# Patient Record
Sex: Male | Born: 2000 | Race: White | Hispanic: No | Marital: Single | Smoking: Never smoker
Health system: Southern US, Community
[De-identification: ages and names within clinical notes are randomized; demographics above are authoritative.]

## PROBLEM LIST (undated history)

## (undated) DIAGNOSIS — J320 Chronic maxillary sinusitis: Secondary | ICD-10-CM

## (undated) DIAGNOSIS — J322 Chronic ethmoidal sinusitis: Secondary | ICD-10-CM

## (undated) DIAGNOSIS — J343 Hypertrophy of nasal turbinates: Secondary | ICD-10-CM

---
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
## 2018-06-02 IMAGING — CT CT MAXILLOFACIAL W/O CM
3 series · 13 of 47 positions shown, 15 images · non-contrast
Comparison: None.

CLINICAL DATA: LEFT-sided congestion.

EXAM:
CT MAXILLOFACIAL WITHOUT CONTRAST
TECHNIQUE: Multidetector CT imaging of the maxillofacial structures was
performed. Multiplanar CT image reconstructions were also generated.
A small metallic BB was placed on the right temple in order to
reliably differentiate right from left.

[Series 2: standard · axial · 0.34mm/px · z∈[+1338,+1434]mm · 7 of 117 slices shown, 9 images]
[im 13/117  brain]
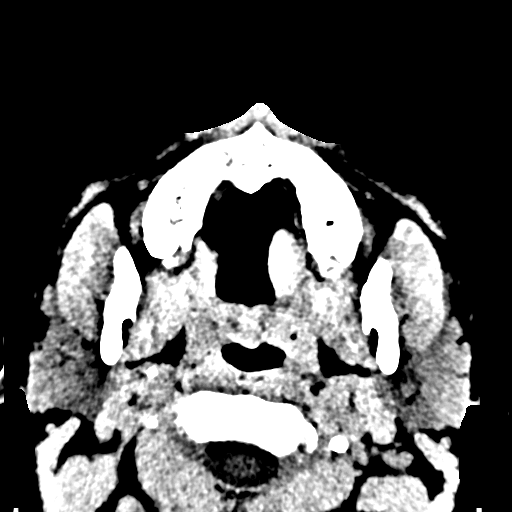
[im 13/117  bone]
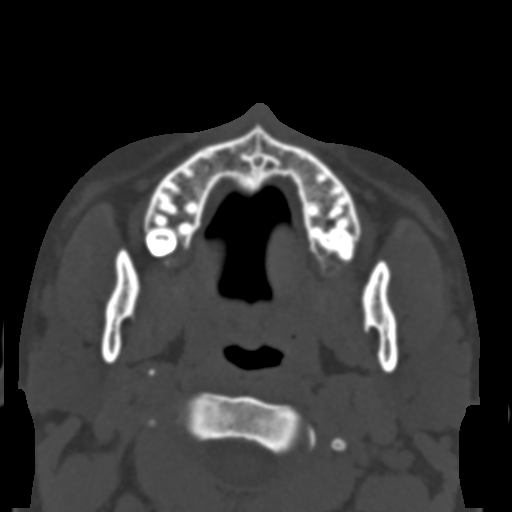
[im 29/117  bone]
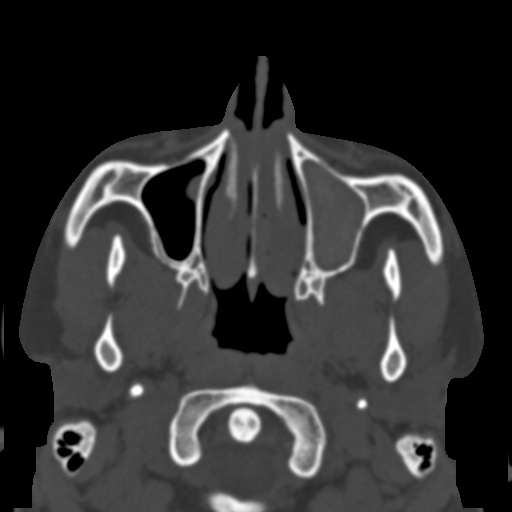
[im 45/117  bone]
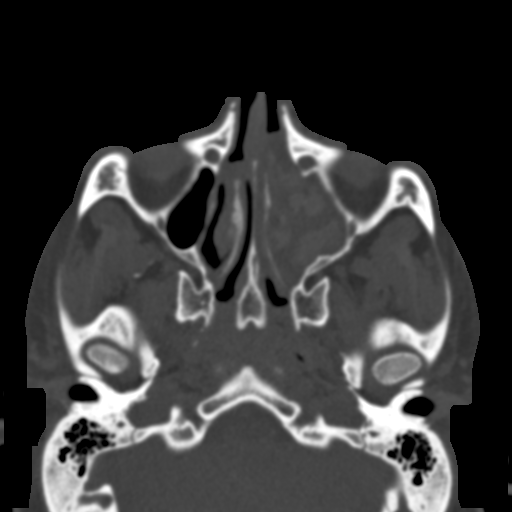
[im 61/117  bone]
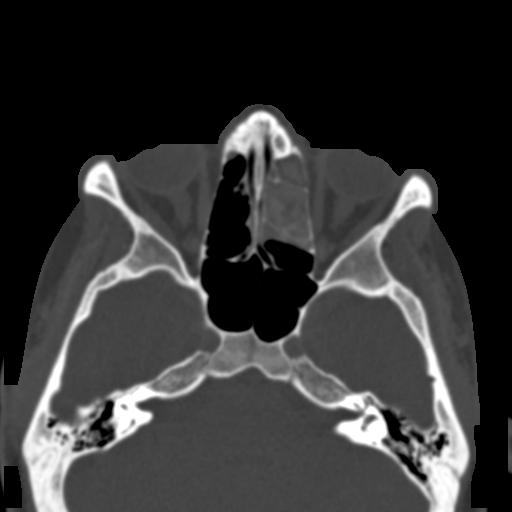
[im 77/117  brain]
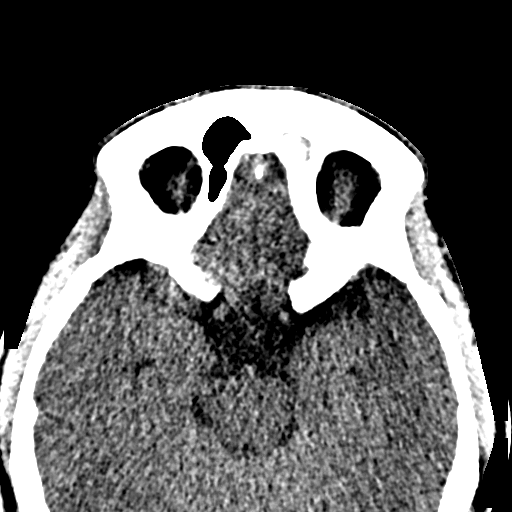
[im 77/117  bone]
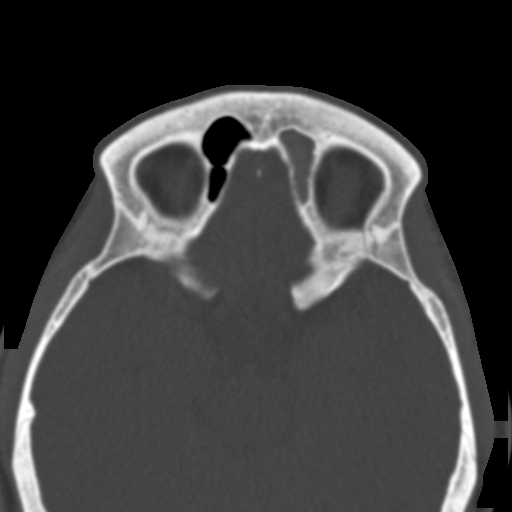
[im 93/117  bone]
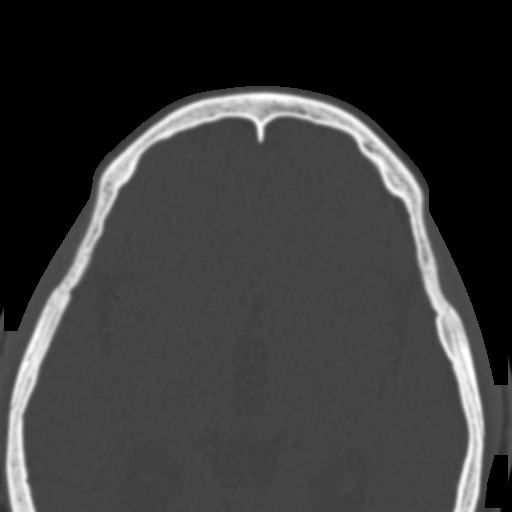
[im 109/117  bone]
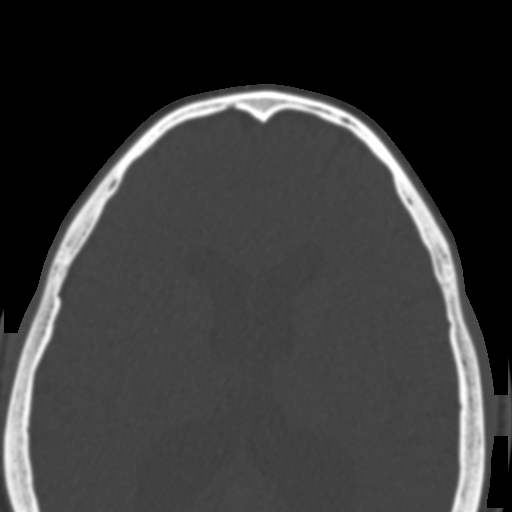

[Series 4: coronal · coronal · 0.25mm/px · 3 of 106 slices shown]
[im 36/106  bone]
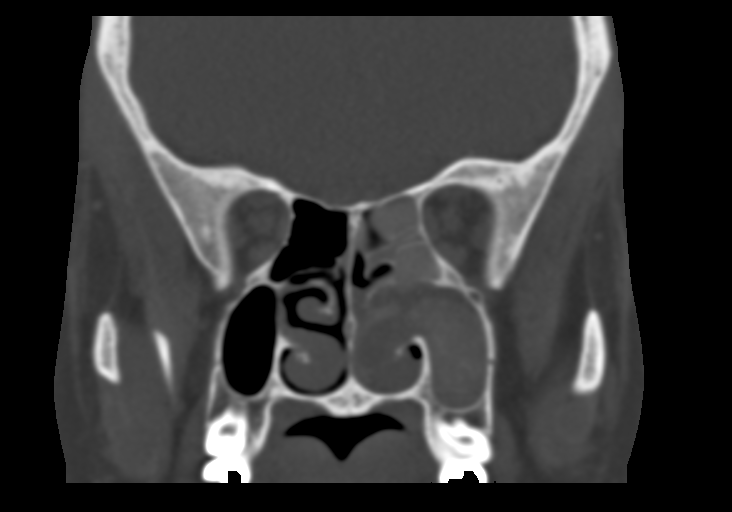
[im 47/106  bone]
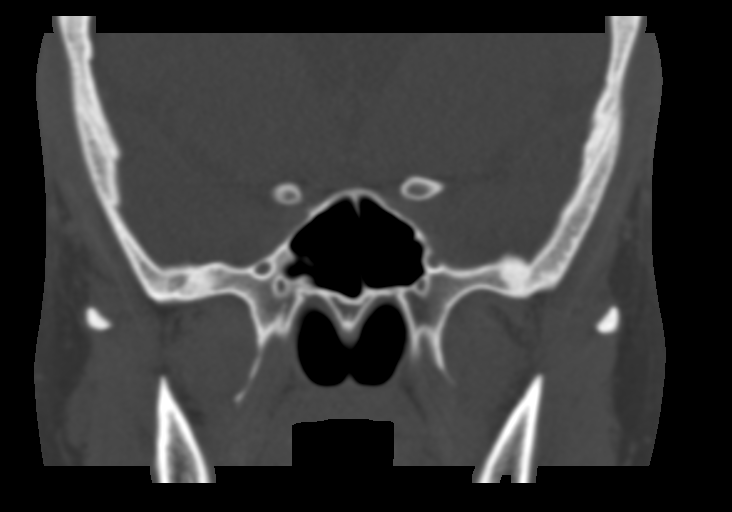
[im 59/106  bone]
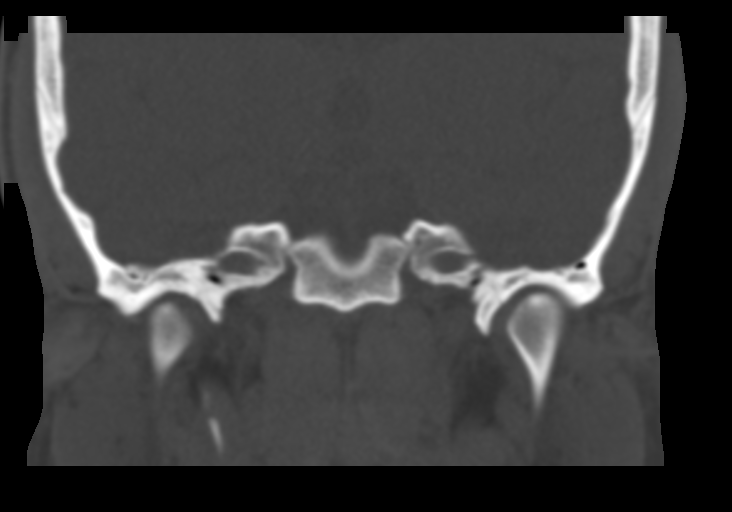

[Series 5: sagittal · sagittal · 0.24mm/px · 3 of 91 slices shown]
[im 31/91  bone]
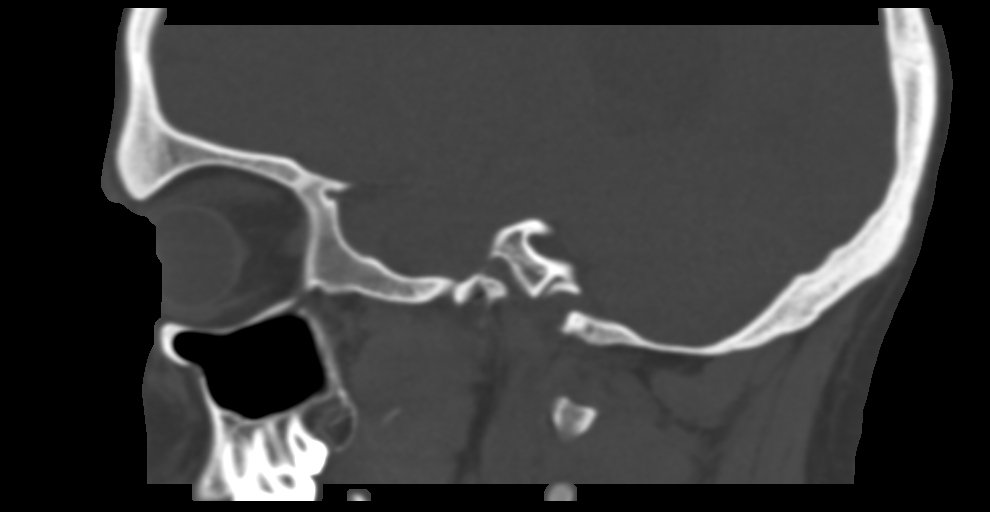
[im 46/91  bone]
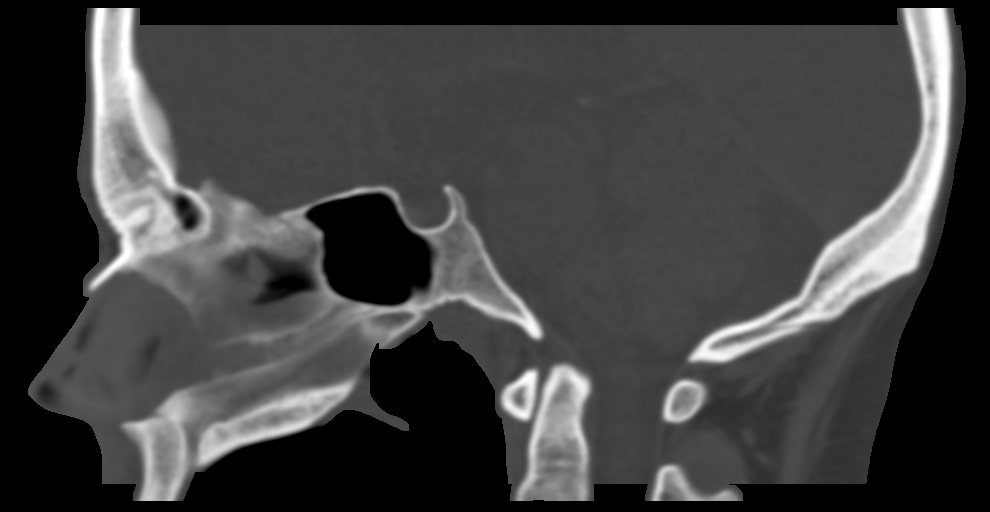
[im 61/91  bone]
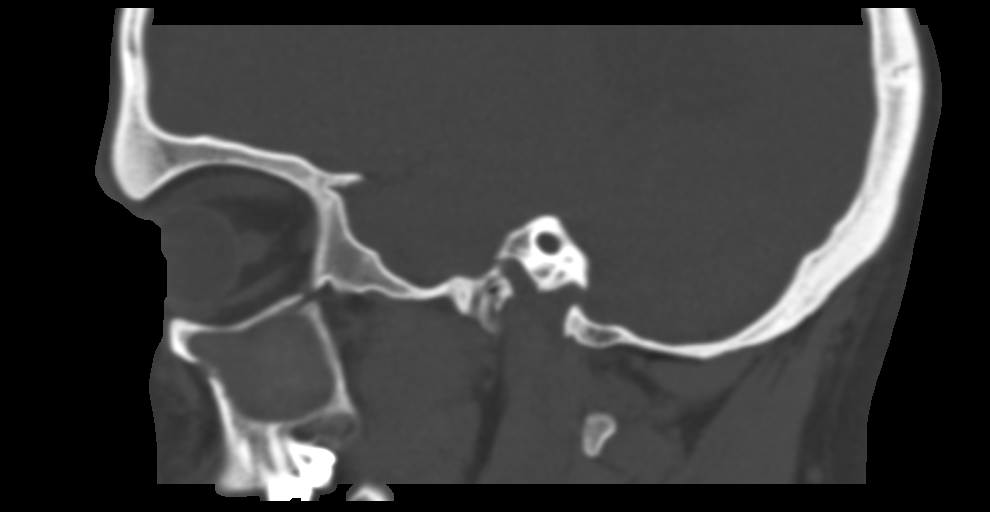

[13 of 47 positions shown; findings below may reference images not displayed]

FINDINGS: Osseous: No acute osseous findings. Expansion of the medial wall of
the LEFT maxillary sinus, centered at the LEFT maxillary sinus
ostium, due to intrinsic pathology, described below. Truncation of
the LEFT middle turbinate and uncinate process.

Orbits: Negative.

Sinuses: RIGHT-sided paranasal sinus are normal. Sphenoid sinus is
normal.

There is an expansile mass in the LEFT maxillary sinus,
hyperattenuating, with extension into the nasal cavity, consistent
with antral choanal polyp and/or mucocele. Ostiomeatal pattern of
obstruction extending into the LEFT frontal sinus, and LEFT anterior
ethmoid air cells, also hyperattenuating suggesting fungal disease.

Soft tissues: No abnormalities are seen in the parapharyngeal or
masticator spaces.

Limited intracranial: Negative.
IMPRESSION: Suspect LEFT maxillary antral choanal polyp or mucocele. Ostiomeatal
pattern of obstruction of the LEFT frontal and LEFT ethmoid sinuses.
Osseous remodeling related to enlargement of the LEFT maxillary
sinus ostium, LEFT middle turbinate and uncinate process.
